# Patient Record
Sex: Female | Born: 1985 | Race: White | Hispanic: No | Marital: Married | Smoking: Former smoker
Health system: Southern US, Community
[De-identification: ages and names within clinical notes are randomized; demographics above are authoritative.]

---

## 2009-11-14 ENCOUNTER — Inpatient Hospital Stay (HOSPITAL_COMMUNITY): Admission: AD | Admit: 2009-11-14 | Discharge: 2009-11-14 | Payer: Self-pay | Admitting: Obstetrics & Gynecology

## 2009-11-15 ENCOUNTER — Inpatient Hospital Stay (HOSPITAL_COMMUNITY): Admission: AD | Admit: 2009-11-15 | Discharge: 2009-11-15 | Payer: Self-pay | Admitting: Obstetrics and Gynecology

## 2009-11-25 ENCOUNTER — Inpatient Hospital Stay (HOSPITAL_COMMUNITY): Admission: AD | Admit: 2009-11-25 | Discharge: 2009-11-28 | Payer: Self-pay | Admitting: Obstetrics and Gynecology

## 2010-12-10 LAB — COMPREHENSIVE METABOLIC PANEL
ALT: 18 U/L (ref 0–35)
ALT: 20 U/L (ref 0–35)
AST: 14 U/L (ref 0–37)
Albumin: 2.4 g/dL — ABNORMAL LOW (ref 3.5–5.2)
Alkaline Phosphatase: 130 U/L — ABNORMAL HIGH (ref 39–117)
Alkaline Phosphatase: 153 U/L — ABNORMAL HIGH (ref 39–117)
BUN: 2 mg/dL — ABNORMAL LOW (ref 6–23)
CO2: 20 mEq/L (ref 19–32)
CO2: 25 mEq/L (ref 19–32)
Calcium: 9.4 mg/dL (ref 8.4–10.5)
Chloride: 101 mEq/L (ref 96–112)
Creatinine, Ser: 0.56 mg/dL (ref 0.4–1.2)
GFR calc Af Amer: >60 mL/min (ref >60)
GFR calc non Af Amer: >60 mL/min (ref >60)
GFR calc non Af Amer: >60 mL/min (ref >60)
Glucose, Bld: 108 mg/dL — ABNORMAL HIGH (ref 70–99)
Potassium: 3.4 mEq/L — ABNORMAL LOW (ref 3.5–5.1)
Potassium: 3.7 mEq/L (ref 3.5–5.1)
Sodium: 132 mEq/L — ABNORMAL LOW (ref 135–145)
Sodium: 133 mEq/L — ABNORMAL LOW (ref 135–145)
Total Bilirubin: 0.2 mg/dL — ABNORMAL LOW (ref 0.3–1.2)
Total Protein: 6.2 g/dL (ref 6.0–8.3)

## 2010-12-10 LAB — CBC
HCT: 36.5 % (ref 36.0–46.0)
Hemoglobin: 12.5 g/dL (ref 12.0–15.0)
Hemoglobin: 12.8 g/dL (ref 12.0–15.0)
MCHC: 33.6 g/dL (ref 30.0–36.0)
MCHC: 34.2 g/dL (ref 30.0–36.0)
MCHC: 34.3 g/dL (ref 30.0–36.0)
MCV: 90.9 fL (ref 78.0–100.0)
Platelets: 290 10*3/uL (ref 150–400)
Platelets: 305 10*3/uL (ref 150–400)
RBC: 3.37 MIL/uL — ABNORMAL LOW (ref 3.87–5.11)
RBC: 3.91 MIL/uL (ref 3.87–5.11)
RBC: 4.05 MIL/uL (ref 3.87–5.11)
RDW: 12.5 % (ref 11.5–15.5)
RDW: 12.8 % (ref 11.5–15.5)
RDW: 13.2 % (ref 11.5–15.5)
WBC: 13.2 10*3/uL — ABNORMAL HIGH (ref 4.0–10.5)

## 2010-12-10 LAB — URINALYSIS, DIPSTICK ONLY
Bilirubin Urine: NEGATIVE
Ketones, ur: NEGATIVE mg/dL
Specific Gravity, Urine: <1.005 — ABNORMAL LOW (ref 1.005–1.030)
Urobilinogen, UA: 0.2 mg/dL (ref 0.0–1.0)

## 2010-12-10 LAB — RPR: RPR Ser Ql: NONREACTIVE

## 2010-12-10 LAB — LACTATE DEHYDROGENASE: LDH: 115 U/L (ref 94–250)

## 2010-12-10 LAB — URIC ACID: Uric Acid, Serum: 4.2 mg/dL (ref 2.4–7.0)

## 2011-05-09 ENCOUNTER — Other Ambulatory Visit: Payer: Self-pay | Admitting: Family Medicine

## 2011-05-09 DIAGNOSIS — M549 Dorsalgia, unspecified: Secondary | ICD-10-CM

## 2011-05-13 ENCOUNTER — Ambulatory Visit
Admission: RE | Admit: 2011-05-13 | Discharge: 2011-05-13 | Disposition: A | Discharge status: Home / Self Care | Payer: BC Managed Care – PPO | Source: Ambulatory Visit | Attending: Family Medicine | Admitting: Family Medicine

## 2011-05-13 DIAGNOSIS — M549 Dorsalgia, unspecified: Secondary | ICD-10-CM

## 2013-12-31 ENCOUNTER — Ambulatory Visit
Admission: RE | Admit: 2013-12-31 | Discharge: 2013-12-31 | Disposition: A | Discharge status: Home / Self Care | Payer: No Typology Code available for payment source | Source: Ambulatory Visit | Attending: Infectious Disease | Admitting: Infectious Disease

## 2013-12-31 ENCOUNTER — Other Ambulatory Visit: Payer: Self-pay | Admitting: Infectious Disease

## 2013-12-31 DIAGNOSIS — R7611 Nonspecific reaction to tuberculin skin test without active tuberculosis: Secondary | ICD-10-CM

## 2019-11-21 ENCOUNTER — Other Ambulatory Visit: Payer: Self-pay

## 2019-11-21 ENCOUNTER — Encounter (HOSPITAL_COMMUNITY): Payer: Self-pay | Admitting: Emergency Medicine

## 2019-11-21 ENCOUNTER — Emergency Department (HOSPITAL_COMMUNITY)
Admission: EM | Admit: 2019-11-21 | Discharge: 2019-11-22 | Disposition: A | Discharge status: Home / Self Care | Payer: BC Managed Care – PPO | Attending: Emergency Medicine | Admitting: Emergency Medicine

## 2019-11-21 DIAGNOSIS — D1800 Hemangioma unspecified site: Secondary | ICD-10-CM

## 2019-11-21 DIAGNOSIS — Z87891 Personal history of nicotine dependence: Secondary | ICD-10-CM | POA: Insufficient documentation

## 2019-11-21 DIAGNOSIS — F4321 Adjustment disorder with depressed mood: Secondary | ICD-10-CM | POA: Diagnosis not present

## 2019-11-21 DIAGNOSIS — Y939 Activity, unspecified: Secondary | ICD-10-CM | POA: Diagnosis not present

## 2019-11-21 DIAGNOSIS — Y929 Unspecified place or not applicable: Secondary | ICD-10-CM | POA: Insufficient documentation

## 2019-11-21 DIAGNOSIS — Y999 Unspecified external cause status: Secondary | ICD-10-CM | POA: Insufficient documentation

## 2019-11-21 DIAGNOSIS — Z79899 Other long term (current) drug therapy: Secondary | ICD-10-CM | POA: Diagnosis not present

## 2019-11-21 DIAGNOSIS — S0990XA Unspecified injury of head, initial encounter: Secondary | ICD-10-CM | POA: Insufficient documentation
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
## 2019-11-22 ENCOUNTER — Emergency Department (HOSPITAL_COMMUNITY): Payer: BC Managed Care – PPO

## 2019-11-22 DIAGNOSIS — Z87891 Personal history of nicotine dependence: Secondary | ICD-10-CM | POA: Diagnosis not present

## 2019-11-22 DIAGNOSIS — Y929 Unspecified place or not applicable: Secondary | ICD-10-CM | POA: Diagnosis not present

## 2019-11-22 DIAGNOSIS — D1800 Hemangioma unspecified site: Secondary | ICD-10-CM | POA: Diagnosis not present

## 2019-11-22 DIAGNOSIS — Y939 Activity, unspecified: Secondary | ICD-10-CM | POA: Diagnosis not present

## 2019-11-22 DIAGNOSIS — S0990XA Unspecified injury of head, initial encounter: Secondary | ICD-10-CM | POA: Diagnosis present

## 2019-11-22 DIAGNOSIS — Z79899 Other long term (current) drug therapy: Secondary | ICD-10-CM | POA: Diagnosis not present

## 2019-11-22 DIAGNOSIS — F4321 Adjustment disorder with depressed mood: Secondary | ICD-10-CM | POA: Diagnosis not present

## 2019-11-22 DIAGNOSIS — Y999 Unspecified external cause status: Secondary | ICD-10-CM | POA: Diagnosis not present

## 2019-11-22 MED ORDER — OXYCODONE-ACETAMINOPHEN 5-325 MG PO TABS: 1.0000 | ORAL_TABLET | Freq: Four times a day (QID) | ORAL | 0 refills | Status: AC | PRN

## 2019-11-22 MED ORDER — GADOBUTROL 1 MMOL/ML IV SOLN
10.0000 mL | Freq: Once | INTRAVENOUS | Status: AC | PRN
  Administered 2019-11-22: 10 mL via INTRAVENOUS

## 2019-11-22 MED ORDER — LORAZEPAM 2 MG/ML IJ SOLN
1.0000 mg | Freq: Once | INTRAMUSCULAR | Status: AC | PRN
  Administered 2019-11-22: 05:00:00 1 mg via INTRAVENOUS
  Filled 2019-11-22: qty 1

## 2019-11-22 MED ORDER — LORAZEPAM 1 MG PO TABS: 1.0000 mg | ORAL_TABLET | Freq: Three times a day (TID) | ORAL | 0 refills | Status: AC | PRN

## 2019-11-22 MED ORDER — OXYCODONE-ACETAMINOPHEN 5-325 MG PO TABS
1.0000 | ORAL_TABLET | Freq: Once | ORAL | Status: AC
  Administered 2019-11-22: 01:00:00 1 via ORAL
  Filled 2019-11-22: qty 1

## 2019-11-22 MED ORDER — OXYCODONE-ACETAMINOPHEN 5-325 MG PO TABS
1.0000 | ORAL_TABLET | Freq: Once | ORAL | Status: AC
  Administered 2019-11-22: 07:00:00 1 via ORAL
  Filled 2019-11-22: qty 1

## 2019-11-22 NOTE — ED Notes (Signed)
Pt to MRI
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
## 2019-11-22 NOTE — ED Notes (Signed)
Report given to Herbert Spires, Charge [facility] ED.

## 2019-11-22 NOTE — Discharge Instructions (Addendum)
Please follow up with Emily Knapp for help through your current situation.   Return to the emergency department with any worsening symptoms or new concerns.

## 2019-11-22 NOTE — ED Notes (Signed)
[facility] dispatch notified for need of transport.  
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
## 2019-11-22 NOTE — ED Notes (Signed)
Pt verbalized understanding of d/c instructions, followup care and s/s requiring return to ED. Pt had no further questions at this time.  

## 2022-01-07 IMAGING — MR MR HEAD WO/W CM
14 of 16 series · 40 of 48 positions shown · IV contrast (gadavist)
Comparison: Head CT from earlier tonight

CLINICAL DATA: Brain mass or lesion

EXAM:
MRI HEAD WITHOUT AND WITH CONTRAST
TECHNIQUE: Multiplanar, multiecho pulse sequences of the brain and surrounding
structures were obtained without and with intravenous contrast.
CONTRAST:  10mL GADAVIST GADOBUTROL 1 MMOL/ML IV SOLN

[Series 5: DWI · axial · 3.0mm · 0.88mm/px · z∈[-80,+61]mm · 5 of 96 slices shown (1 of 4)]
[im 1/96]
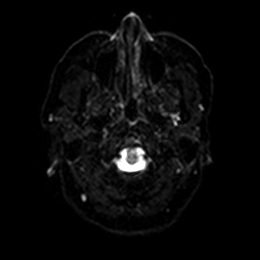
[im 24/96]
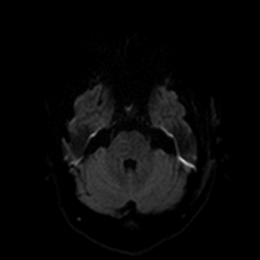
[im 48/96]
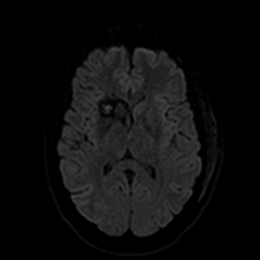
[im 72/96]
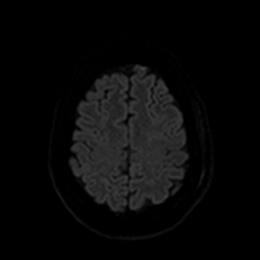
[im 96/96]
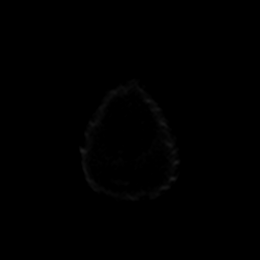

[Series 6: DWI · axial · 3.0mm · 0.88mm/px · z∈[-80,+61]mm · 2 of 48 slices shown (2 of 4)]
[im 1/48]
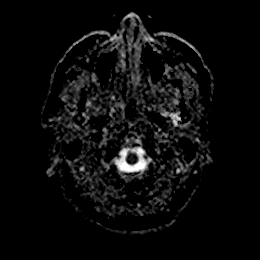
[im 48/48]
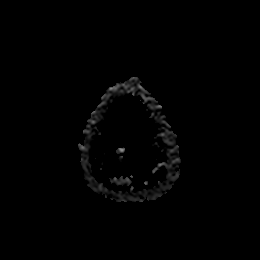

[Series 7: DWI · coronal · 4.0mm · 0.88mm/px · 4 of 68 slices shown (3 of 4)]
[im 1/68]
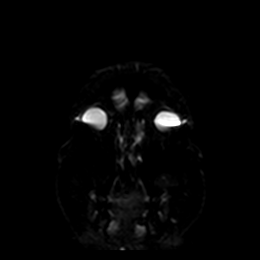
[im 23/68]
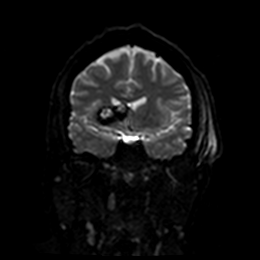
[im 45/68]
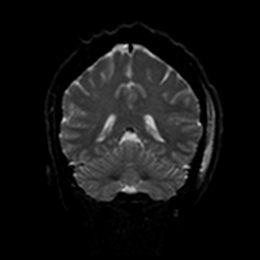
[im 68/68]
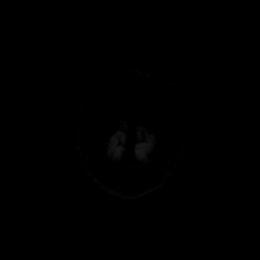

[Series 8: DWI · coronal · 4.0mm · 0.88mm/px · 2 of 34 slices shown (4 of 4)]
[im 1/34]
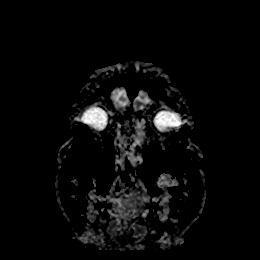
[im 34/34]
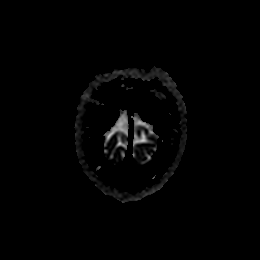

[Series 9: T1 · sagittal · 5.0mm · 0.75mm/px · 2 of 25 slices shown]
[im 1/25]
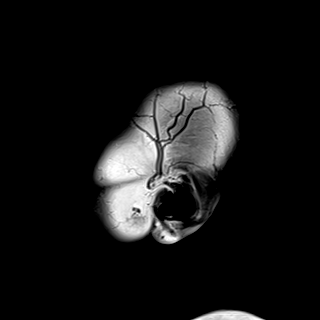
[im 25/25]
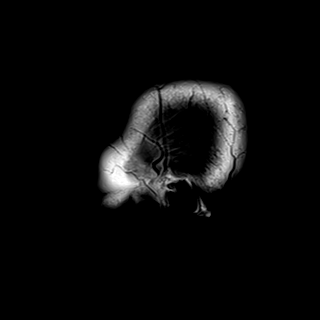

[Series 10: T2 · axial · 5.0mm · 0.72mm/px · z∈[-105,+50]mm · 2 of 27 slices shown]
[im 1/27]
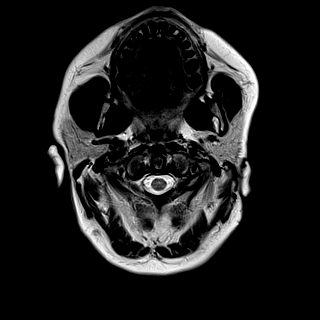
[im 27/27]
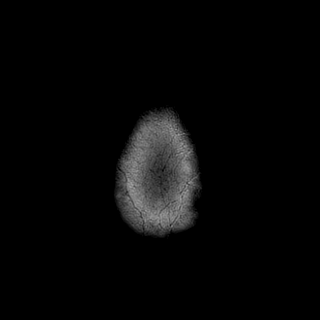

[Series 11: FLAIR · axial · 5.0mm · 0.45mm/px · z∈[-102,+52]mm · 2 of 27 slices shown]
[im 1/27]
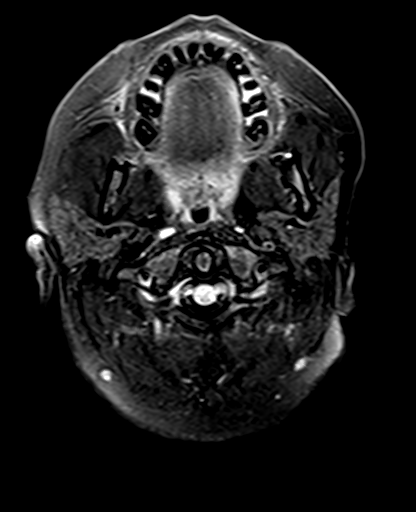
[im 27/27]
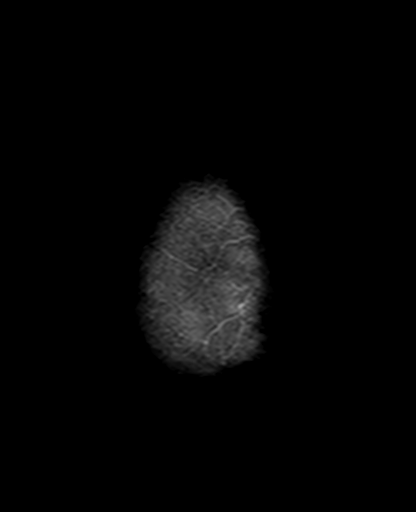

[Series 12: mag_images · axial · 3.0mm · 0.90mm/px · z∈[-113,+63]mm · 4 of 60 slices shown]
[im 1/60]
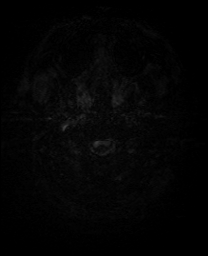
[im 20/60]
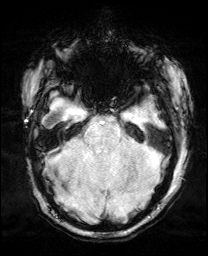
[im 40/60]
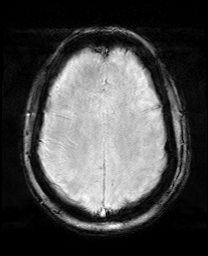
[im 60/60]
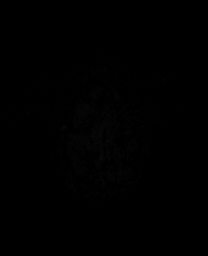

[Series 13: pha_images · axial · 3.0mm · 0.90mm/px · z∈[-113,+60]mm · 4 of 59 slices shown]
[im 1/59]
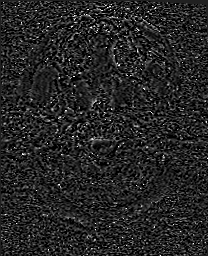
[im 20/59]
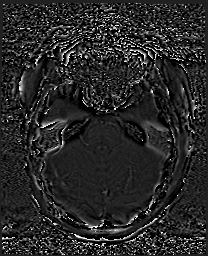
[im 39/59]
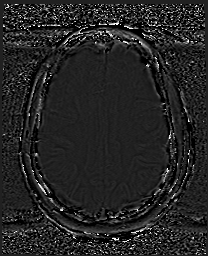
[im 59/59]
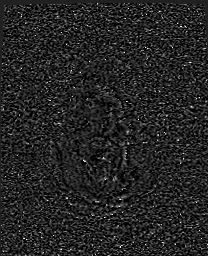

[Series 14: swi_images · axial · 3.0mm · 0.90mm/px · z∈[-113,+63]mm · 4 of 60 slices shown]
[im 1/60]
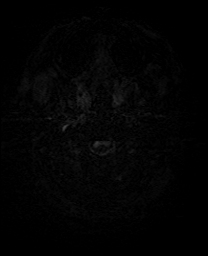
[im 20/60]
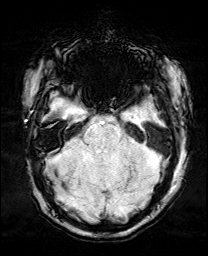
[im 40/60]
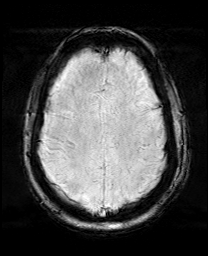
[im 60/60]
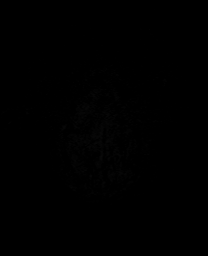

[Series 15: mip_images(sw) · axial · 24.0mm · 0.90mm/px · z∈[-102,+52]mm · 3 of 53 slices shown]
[im 1/53]
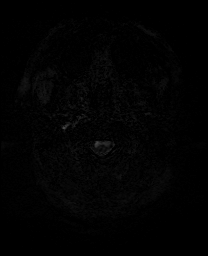
[im 27/53]
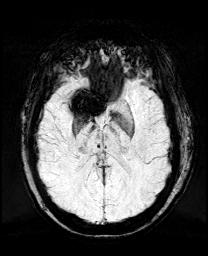
[im 53/53]
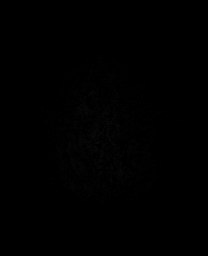

[Series 17: T2 post-contrast · coronal · 5.0mm · 0.72mm/px · 2 of 31 slices shown]
[im 1/31]
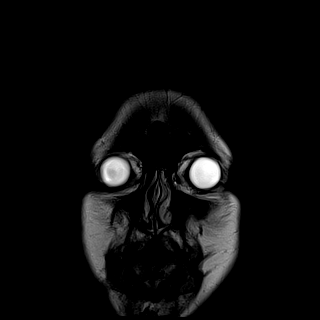
[im 31/31]
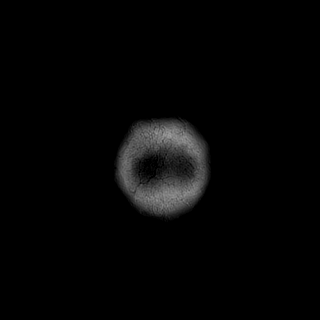

[Series 19: T1 post-contrast · coronal · 5.0mm · 0.34mm/px · 2 of 30 slices shown (1 of 2)]
[im 1/30]
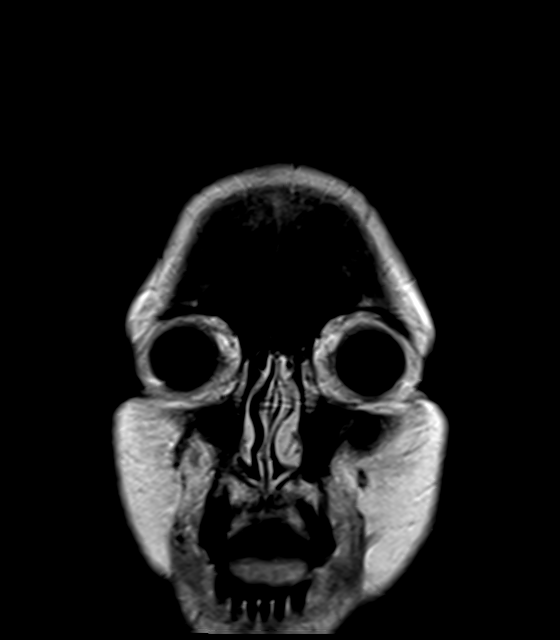
[im 30/30]
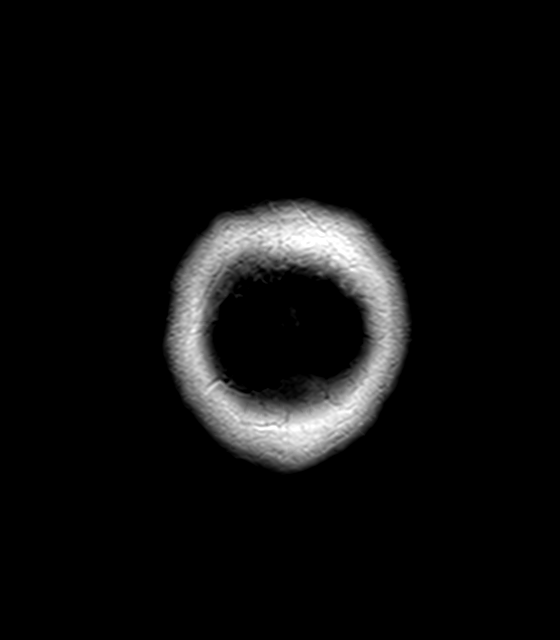

[Series 20: T1 post-contrast · sagittal · 5.0mm · 0.72mm/px · 2 of 25 slices shown (2 of 2)]
[im 1/25]
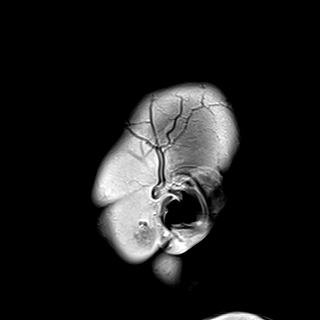
[im 25/25]
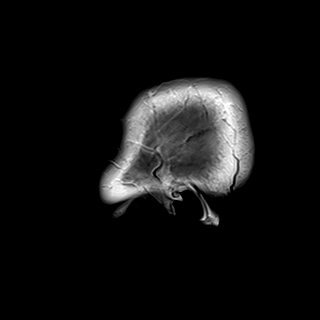

[40 of 48 positions shown; findings below may reference images not displayed]

FINDINGS: Brain: 2 contiguous masses centered at the right basal ganglia,
spanning the anterior putamen and caudate head, both measuring
approximately 16 mm each. These have a hemosiderin rim and stippled
appearance with areas of T1 intrinsic hyperintensity. There is an
associated developmental venous anomaly extending inferiorly from
the lesions. Findings are consistent with cavernoma. No evidence of
acute or recent hemorrhage.

Choroid fissure cyst on the right measuring 13 mm anterior to
posterior.

No intracranial acute hemorrhage, swelling, or collection. Negative
for infarct hydrocephalus.

Vascular: DVA as noted above.

Skull and upper cervical spine: Negative for marrow lesion.

Sinuses/Orbits: Negative

Other: Left scalp swelling correlating with trauma history.
IMPRESSION: 1. The CT abnormality reflects 2 contiguous cavernomas at the right
basal ganglia, each measuring up to 16 mm.
2. Left-sided scalp swelling.  No traumatic intracranial finding.

## 2022-01-07 IMAGING — CT CT HEAD W/O CM
3 series · 14 of 47 positions shown, 16 images · non-contrast
Comparison: None available.

CLINICAL DATA: Initial evaluation for acute posttraumatic headache,
facial trauma.

EXAM:
CT HEAD WITHOUT CONTRAST
CT MAXILLOFACIAL WITHOUT CONTRAST
TECHNIQUE: Multidetector CT imaging of the head and maxillofacial structures
were performed using the standard protocol without intravenous
contrast. Multiplanar CT image reconstructions of the maxillofacial
structures were also generated.

[Series 3: head wo · axial · 0.46mm/px · z∈[-187,-52]mm · 8 of 33 slices shown, 10 images]
[im 3/33  brain]
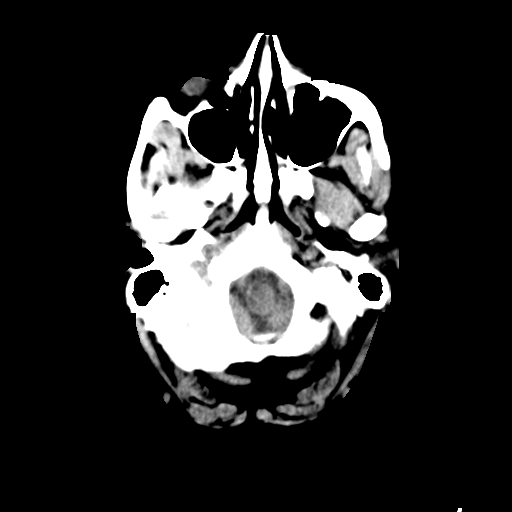
[im 3/33  bone]
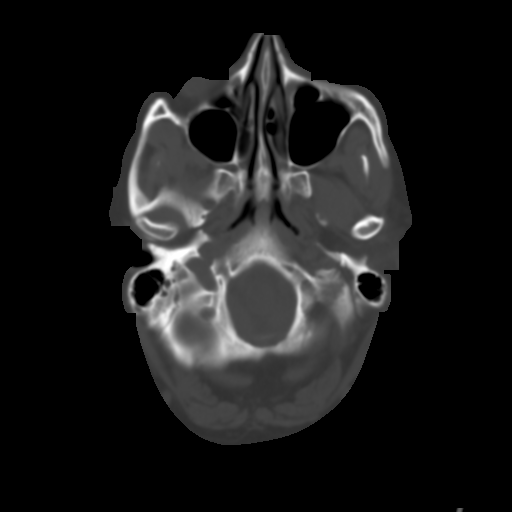
[im 7/33  brain]
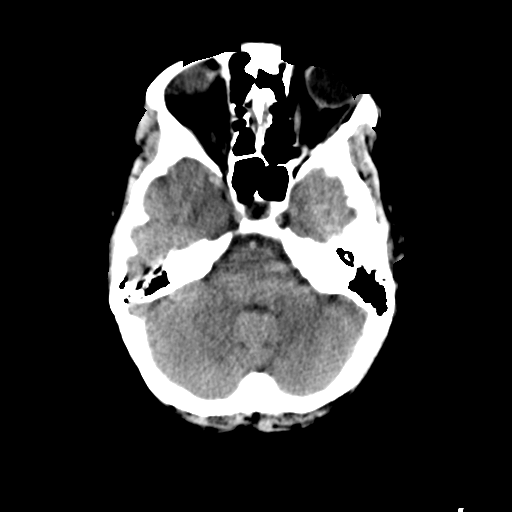
[im 10/33  brain]
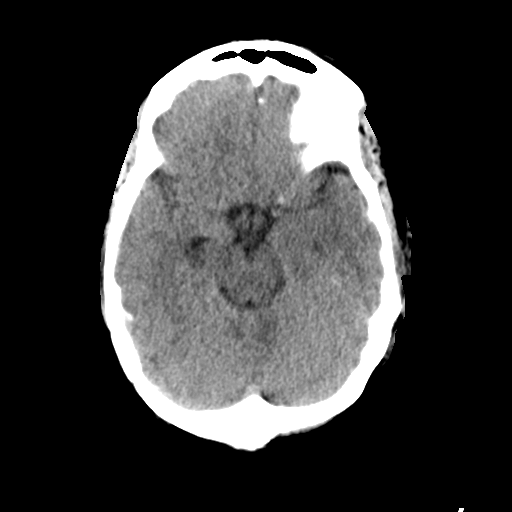
[im 15/33  brain]
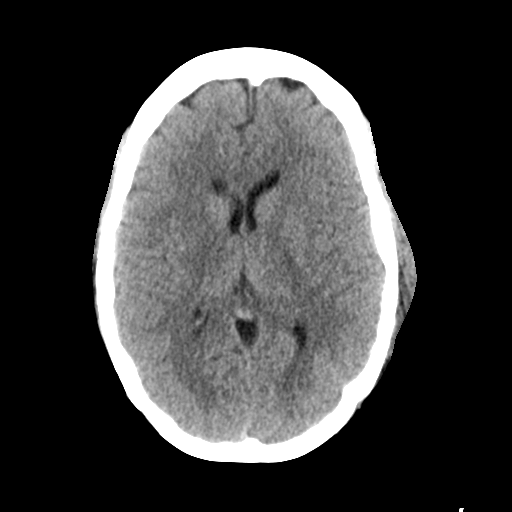
[im 18/33  brain]
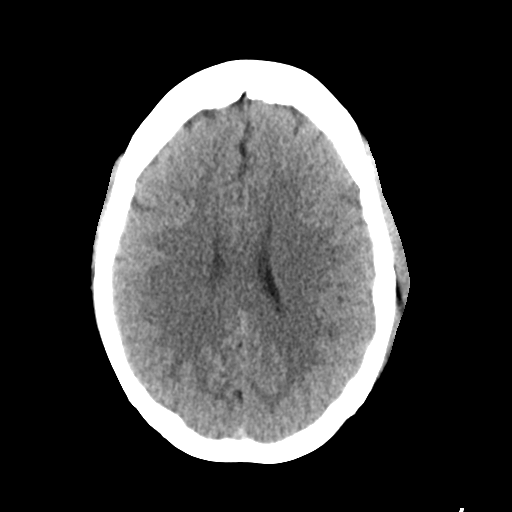
[im 18/33  bone]
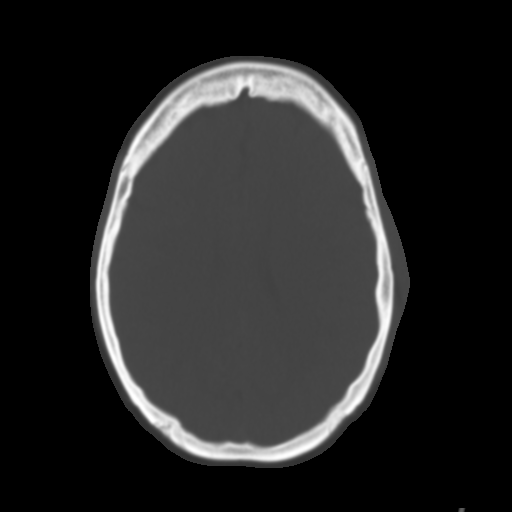
[im 23/33  brain]
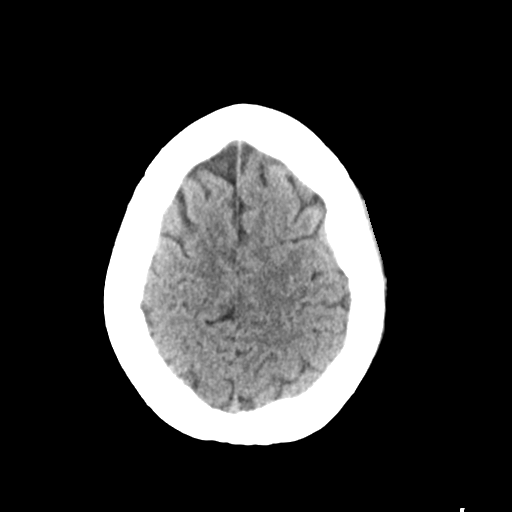
[im 26/33  brain]
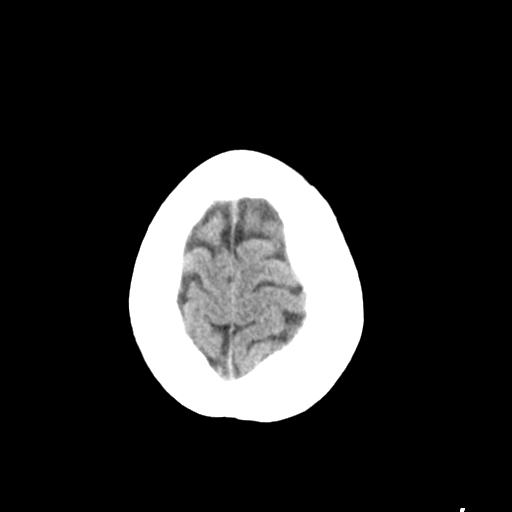
[im 30/33  brain]
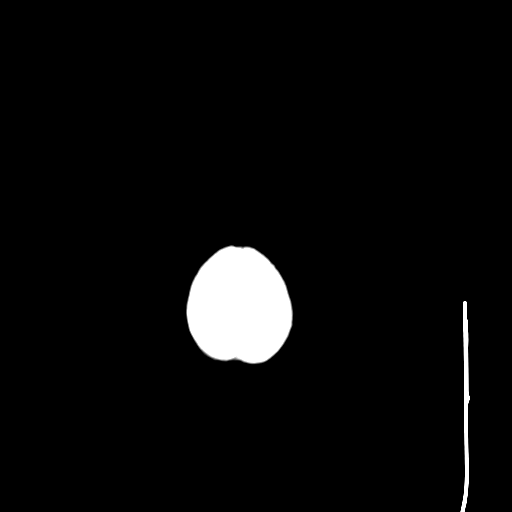

[Series 6: coronal soft tissue · coronal · 0.31mm/px · 3 of 71 slices shown]
[im 24/71  brain]
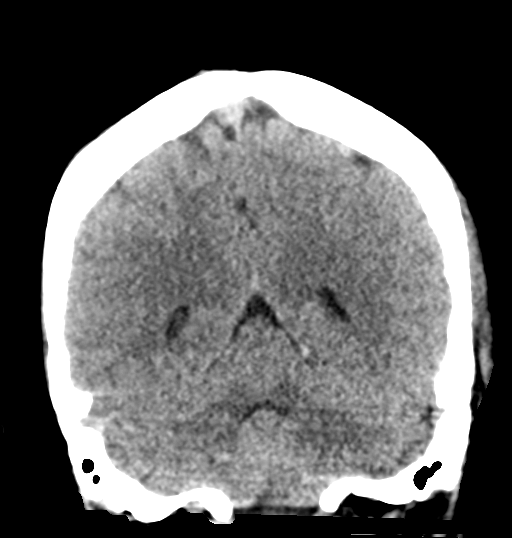
[im 32/71  brain]
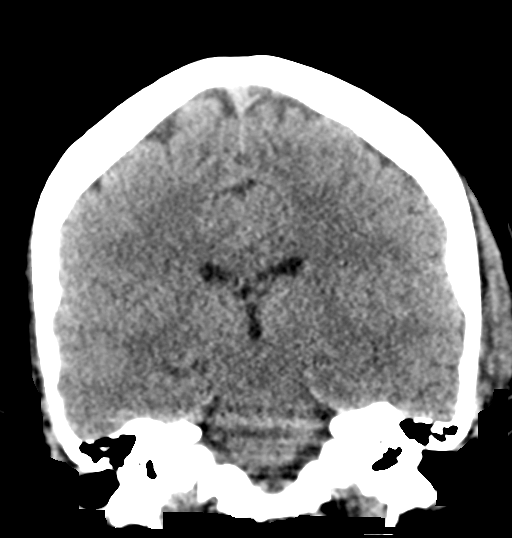
[im 39/71  brain]
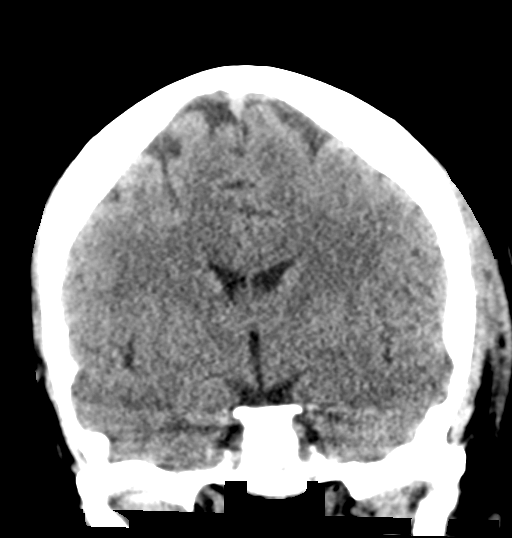

[Series 7: sagittal soft tissue · sagittal · 0.33mm/px · 3 of 57 slices shown]
[im 19/57  brain]
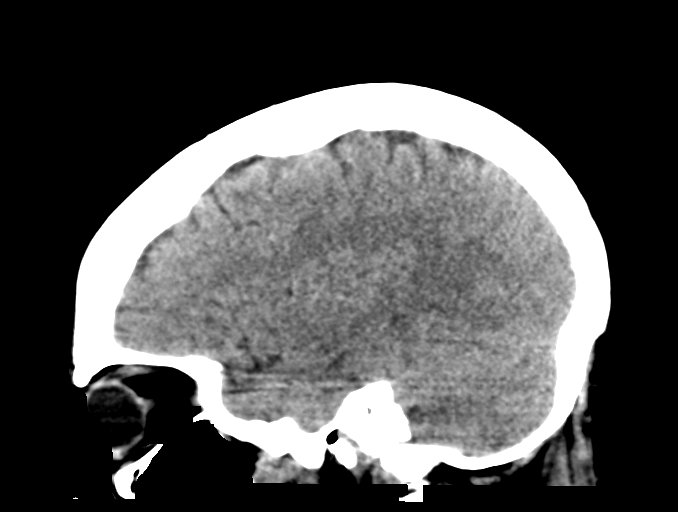
[im 29/57  brain]
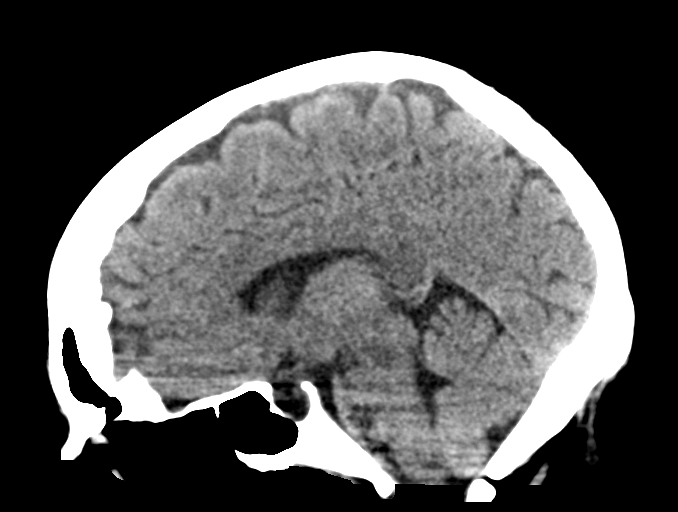
[im 38/57  brain]
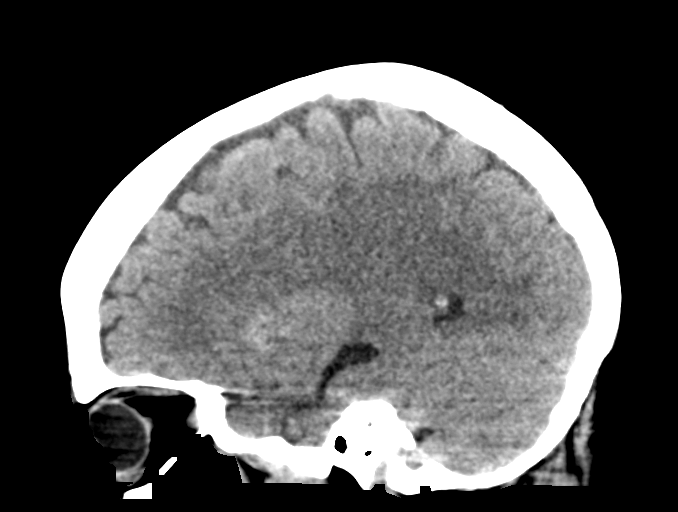

[14 of 47 positions shown; findings below may reference images not displayed]

FINDINGS: CT HEAD FINDINGS

Brain: Cerebral volume within normal limits. No acute intracranial
hemorrhage. No acute large vessel territory infarct. No extra-axial
fluid collection. No hydrocephalus.

Abnormal hyperdensity seen involving the anterior aspect of the
right lentiform nucleus and right caudate head, with protrusion into
the right lateral ventricle, concerning for underlying mass lesion
(series 3, image 13). This measures up to approximately 2.9 cm in
greatest diameter. No significant edema or mass effect. No midline
shift. No other appreciable mass lesion.

Vascular: No hyperdense vessel.

Skull: Soft tissue contusion seen involving the left frontotemporal
scalp. Calvarium intact.

Other: Mastoid air cells are clear.

CT MAXILLOFACIAL FINDINGS

Osseous: Zygomatic arches intact. No acute maxillary fracture.
Pterygoid plates intact. Nasal bones intact. Left-to-right nasal
septal deviation without fracture. Mandible intact. Mandibular
condyles normally situated. No acute abnormality about the
dentition. 18 x 8 mm somewhat smudgy calcific density seen at the
left floor of mouth adjacent to the left mandibular body (series 4,
image 60), but could a stone and/or calcification within the left
submandibular duct. No evidence for acute sialoadenitis.

Orbits: Globes orbital soft tissues within normal limits. Bony
orbits intact.

Sinuses: Paranasal sinuses are clear.

Soft tissues: No other acute soft tissue injury about the face.
IMPRESSION: CT HEAD:

1. No acute intracranial abnormality.
2. Soft tissue contusion involving the left frontotemporal scalp. No
calvarial fracture.
3. Approximate 2.9 cm hyperdensity involving the right basal
ganglia, concerning for underlying mass lesion. No associated edema
or regional mass effect. Further assessment with dedicated MRI, with
and without contrast, recommended.

CT MAXILLOFACIAL:

1. No acute maxillofacial injury identified.  No fracture.
2. 18 mm calcific density at the left lower of mouth, nonspecific,
but could reflect a stone within the left submandibular duct.
Correlation with physical exam recommended. No evidence for acute
sialoadenitis.

## 2024-04-11 ENCOUNTER — Other Ambulatory Visit: Payer: Self-pay | Admitting: Medical Genetics

## 2024-05-20 ENCOUNTER — Other Ambulatory Visit: Payer: Self-pay

## 2024-05-20 DIAGNOSIS — Z006 Encounter for examination for normal comparison and control in clinical research program: Secondary | ICD-10-CM

## 2024-05-28 LAB — GENECONNECT MOLECULAR SCREEN: Genetic Analysis Overall Interpretation: NEGATIVE
